# Patient Record
Sex: Male | Born: 1950 | Race: White | Hispanic: No | Marital: Single | Smoking: Former smoker
Health system: Southern US, Community
[De-identification: ages and names within clinical notes are randomized; demographics above are authoritative.]

---

## 2005-11-11 ENCOUNTER — Ambulatory Visit: Payer: Self-pay | Admitting: Unknown Physician Specialty

## 2009-05-30 ENCOUNTER — Ambulatory Visit: Payer: Self-pay | Admitting: Family Medicine

## 2010-12-24 IMAGING — US SCREENING ULTRASOUND OF ABDOMINAL AORTA
1 series · 17 of 20 positions shown · non-contrast
Comparison: none

REASON FOR EXAM: smoker family Hx aorta aneurysm
COMMENTS:

[Series 1: screening ultrasound of abdominal aorta · 17 of 20 slices shown]
[im 1/20]
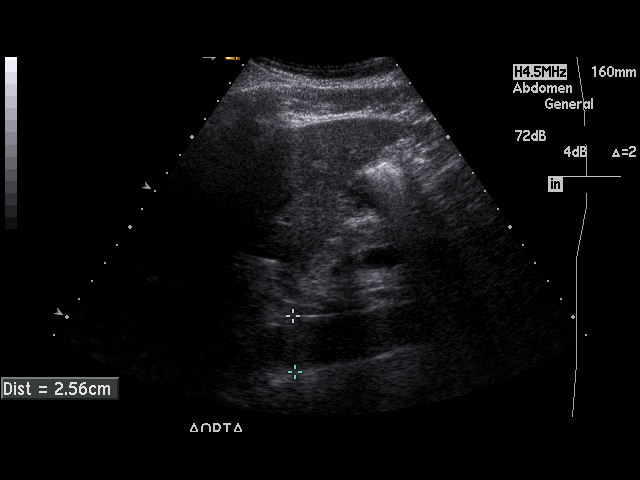
[im 2/20]
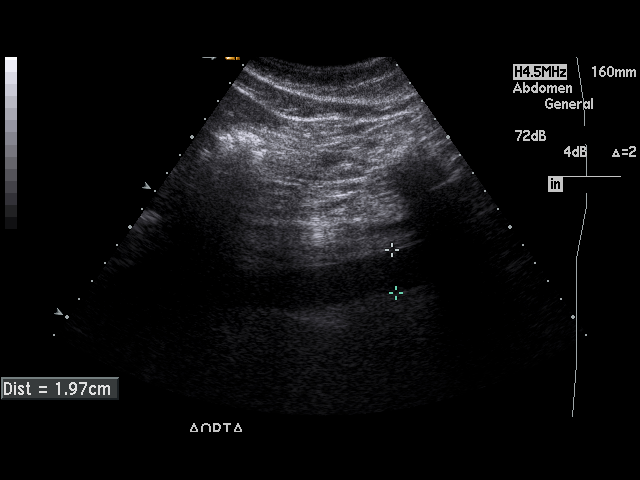
[im 3/20]
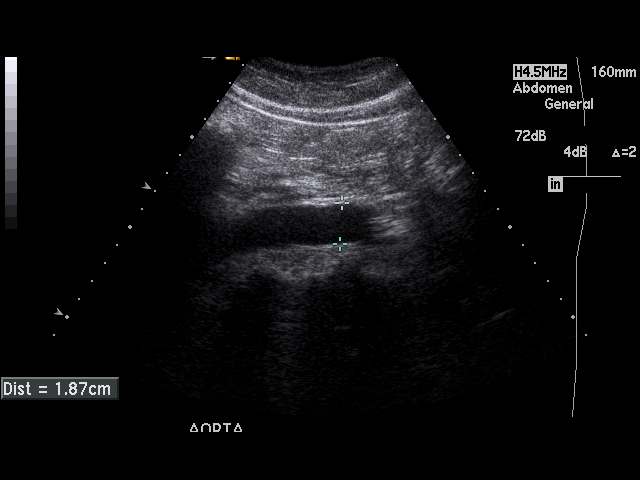
[im 5/20]
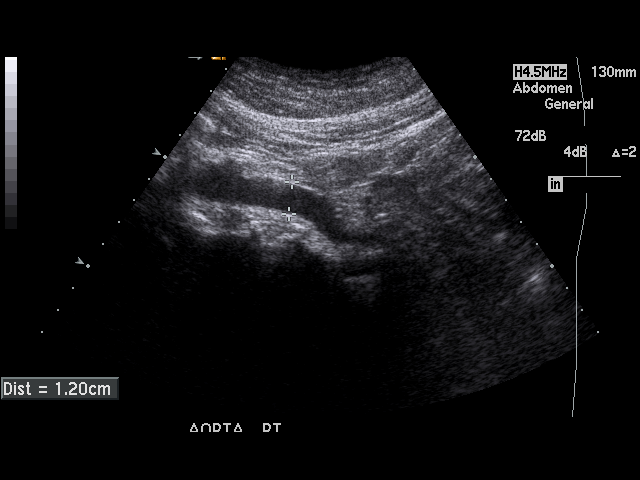
[im 6/20]
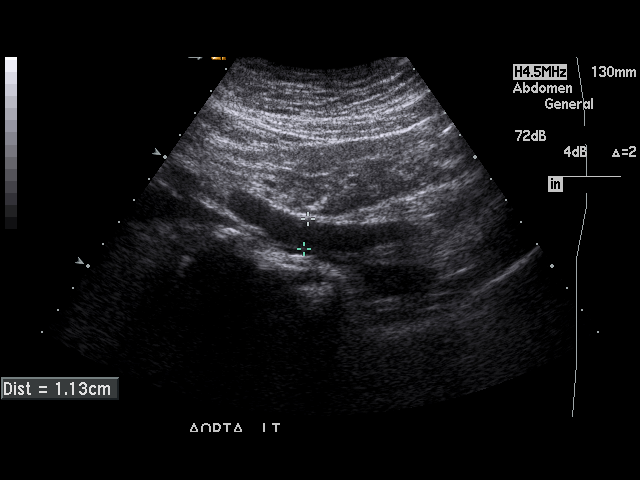
[im 7/20]
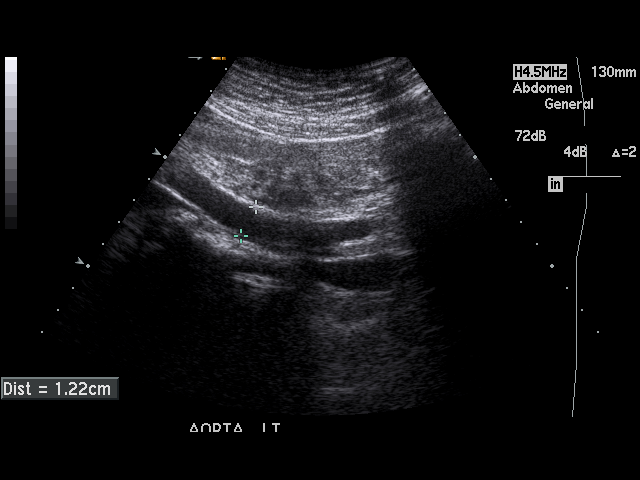
[im 8/20]
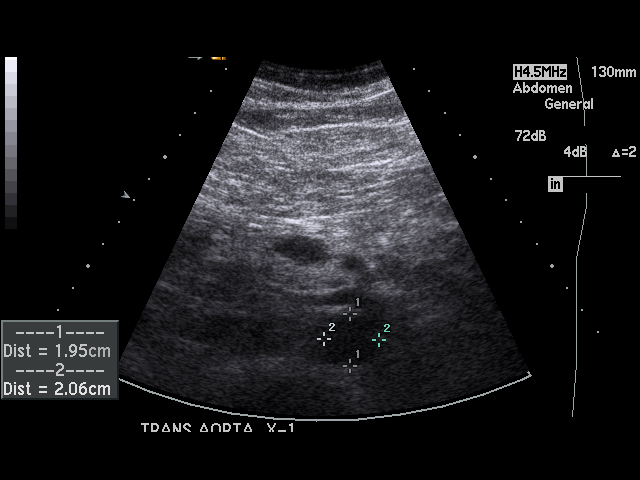
[im 9/20]
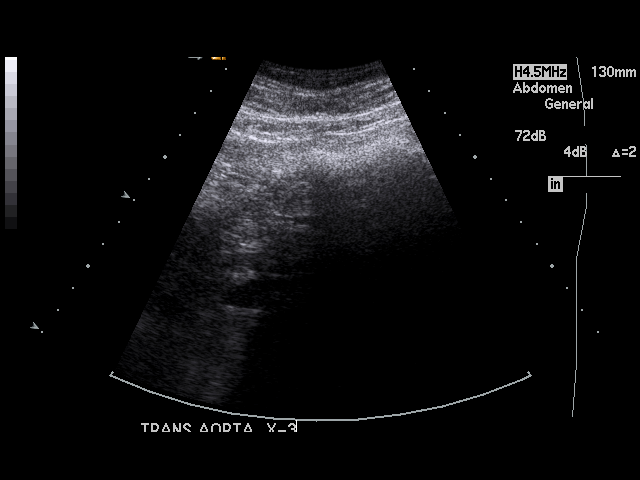
[im 11/20]
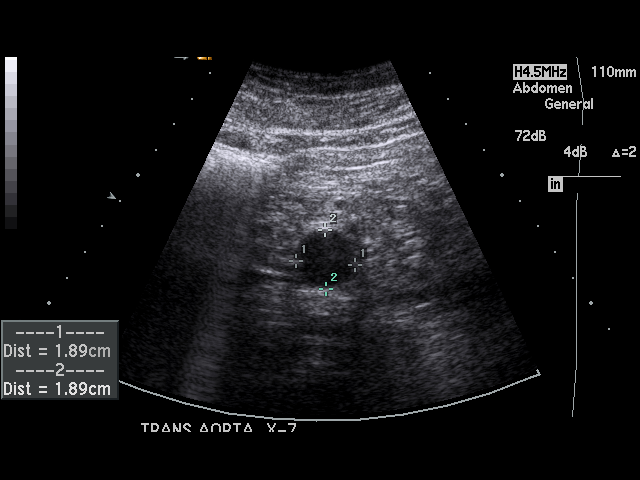
[im 12/20]
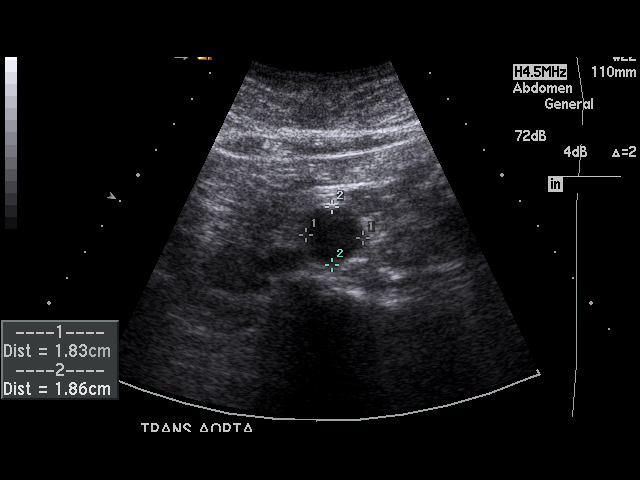
[im 13/20]
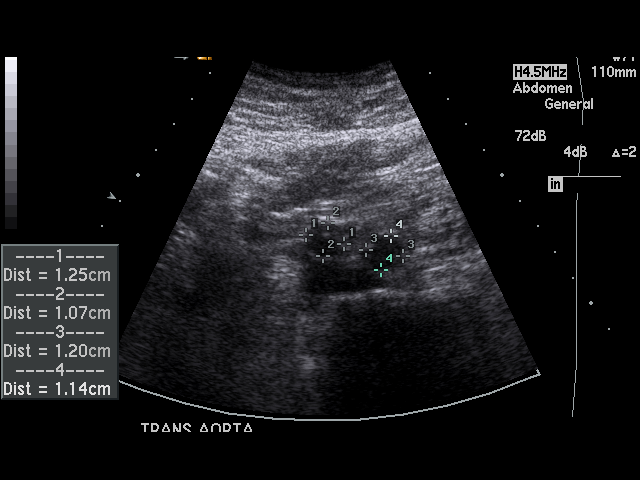
[im 14/20]
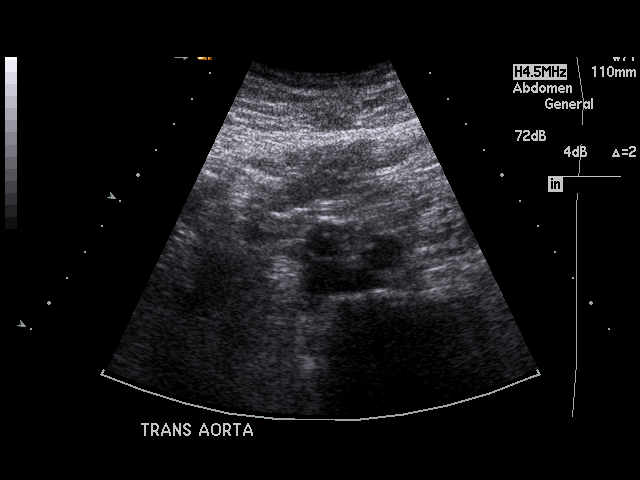
[im 15/20]
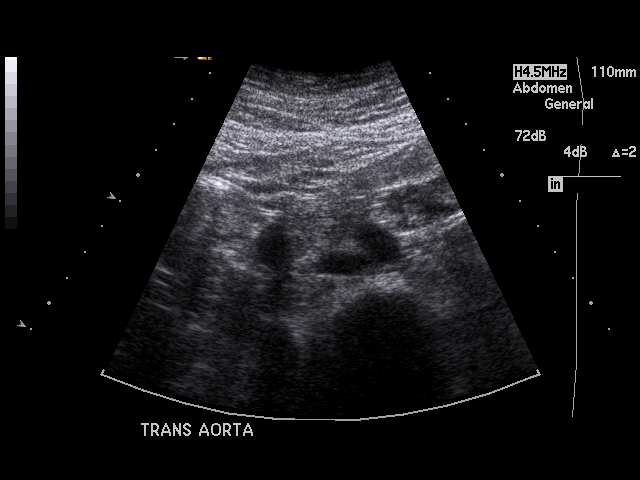
[im 16/20]
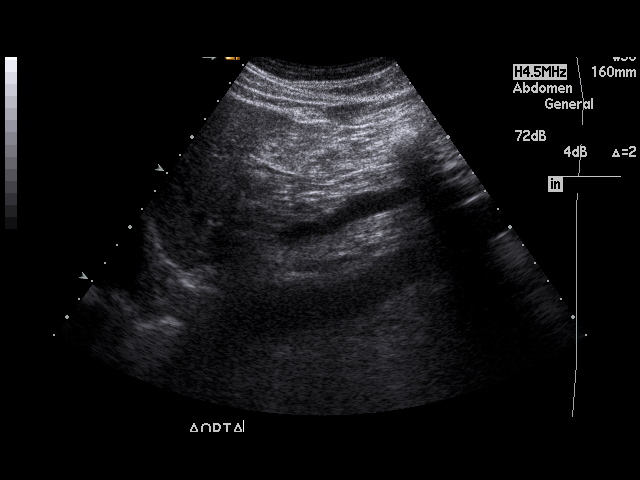
[im 18/20]
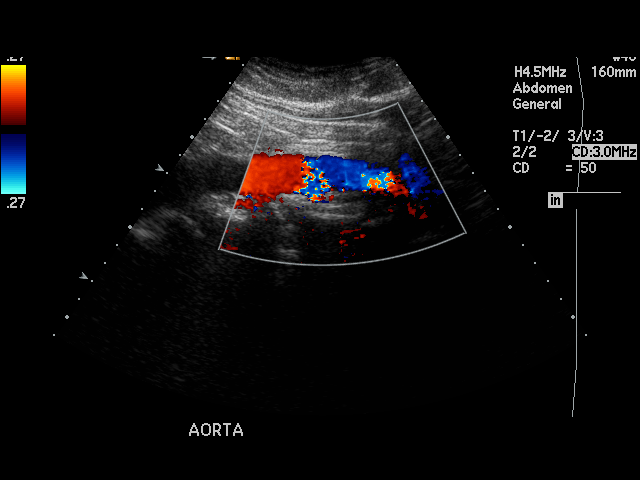
[im 19/20]
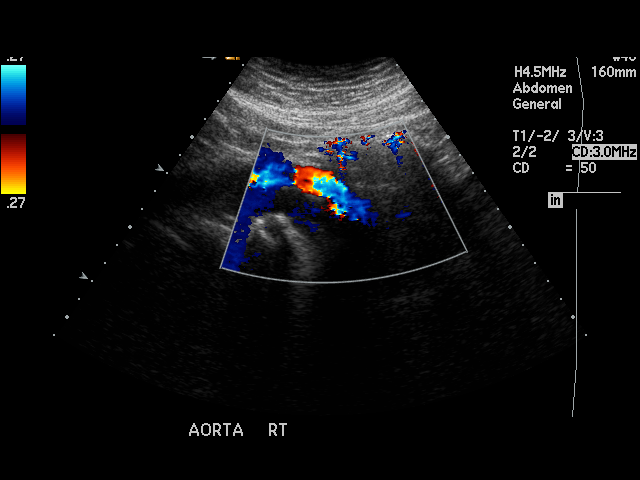
[im 20/20]
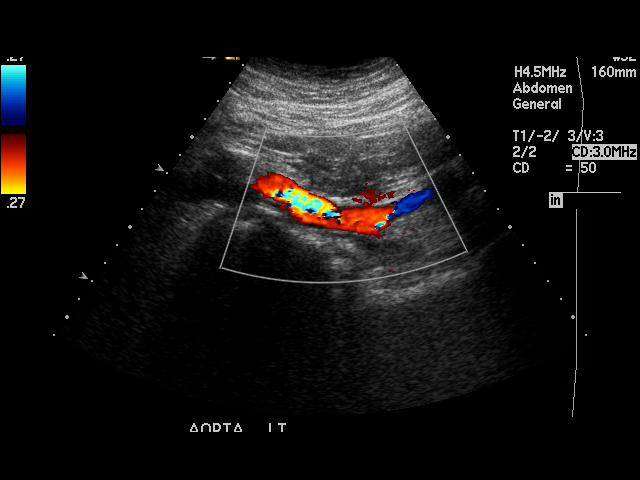

[17 of 20 positions shown; findings below may reference images not displayed]

PROCEDURE:     JAVAUNY - JAVAUNY EXAM AAA SCREENING  - May 30, 2009 [DATE]

RESULT:     The abdominal aorta is visualized and is of normal caliber. The
maximum diameter of the abdominal aorta is in its upper portion where the
maximum AP diameter is 1.95 cm which is well within normal limits. The
common iliac arteries are visualized and are slightly ectatic.
IMPRESSION: 1. No abdominal aorta aneurysm is seen.
2. There is slight ectasia of the common iliac arteries.

## 2011-01-15 ENCOUNTER — Ambulatory Visit: Payer: Self-pay | Admitting: Unknown Physician Specialty

## 2011-01-16 LAB — PATHOLOGY REPORT

## 2021-04-30 ENCOUNTER — Other Ambulatory Visit: Payer: Self-pay | Admitting: Podiatry

## 2021-04-30 ENCOUNTER — Encounter: Payer: Self-pay | Admitting: Podiatry

## 2021-04-30 ENCOUNTER — Ambulatory Visit (INDEPENDENT_AMBULATORY_CARE_PROVIDER_SITE_OTHER): Payer: No Typology Code available for payment source | Admitting: Podiatry

## 2021-04-30 ENCOUNTER — Ambulatory Visit (INDEPENDENT_AMBULATORY_CARE_PROVIDER_SITE_OTHER): Payer: No Typology Code available for payment source

## 2021-04-30 ENCOUNTER — Other Ambulatory Visit: Payer: Self-pay

## 2021-04-30 DIAGNOSIS — M2142 Flat foot [pes planus] (acquired), left foot: Secondary | ICD-10-CM

## 2021-04-30 DIAGNOSIS — M2141 Flat foot [pes planus] (acquired), right foot: Secondary | ICD-10-CM

## 2021-04-30 DIAGNOSIS — M778 Other enthesopathies, not elsewhere classified: Secondary | ICD-10-CM

## 2021-04-30 DIAGNOSIS — G5761 Lesion of plantar nerve, right lower limb: Secondary | ICD-10-CM | POA: Diagnosis not present

## 2021-04-30 NOTE — Progress Notes (Signed)
°  Subjective:  Patient ID: Theodore Thompson, male    DOB: 07/16/1950,  MRN: [id number]  Chief Complaint  Patient presents with   Foot Pain    Patient presents today for right ball of foot pain under 2nd toe.  He states "its not as much pain as it is numbness and tingling that I have"    71 y.o. male presents with the above complaint. History confirmed with patient.   Objective:  Physical Exam: warm, good capillary refill, no trophic changes or ulcerative lesions, normal DP and PT pulses, normal sensory exam, and pes planus bilaterally.  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 Pes planus of both feet      Plan:  Patient was evaluated and treated and all questions answered.  His pes planus deformity and thinning of the metatarsal fat pad which I think is contributing to the formation of the neuroma which is what I discussed with this likely is.  We discussed etiology and treatment options including injections and surgical excision.  Recommended supporting with a custom molded orthosis and I wrote him a prescription for this to be done at [facility].  Additionally I recommended injection today.  Following sterile prep with alcohol 2 mg of dexamethasone and 5 mg of Kenalog was injected into the second or space on the plantar proper digital nerve he tolerated this well.  Discussed need for further injections he will return as needed for these if becomes bothersome again  Return if symptoms worsen or fail to improve.
